# Patient Record
Sex: Female | Born: 1995 | Race: White | Hispanic: No | Marital: Single | Smoking: Never smoker
Health system: Southern US, Community
[De-identification: ages and names within clinical notes are randomized; demographics above are authoritative.]

## PROBLEM LIST (undated history)

## (undated) DIAGNOSIS — G8929 Other chronic pain: Secondary | ICD-10-CM

## (undated) DIAGNOSIS — S069XAA Unspecified intracranial injury with loss of consciousness status unknown, initial encounter: Secondary | ICD-10-CM

## (undated) DIAGNOSIS — E282 Polycystic ovarian syndrome: Secondary | ICD-10-CM

## (undated) DIAGNOSIS — M545 Low back pain: Secondary | ICD-10-CM

## (undated) DIAGNOSIS — M5136 Other intervertebral disc degeneration, lumbar region: Secondary | ICD-10-CM

## (undated) DIAGNOSIS — M25539 Pain in unspecified wrist: Secondary | ICD-10-CM

## (undated) DIAGNOSIS — A6000 Herpesviral infection of urogenital system, unspecified: Secondary | ICD-10-CM

## (undated) DIAGNOSIS — S069X9A Unspecified intracranial injury with loss of consciousness of unspecified duration, initial encounter: Secondary | ICD-10-CM

## (undated) DIAGNOSIS — F909 Attention-deficit hyperactivity disorder, unspecified type: Secondary | ICD-10-CM

## (undated) DIAGNOSIS — M5126 Other intervertebral disc displacement, lumbar region: Secondary | ICD-10-CM

## (undated) DIAGNOSIS — T7421XA Adult sexual abuse, confirmed, initial encounter: Secondary | ICD-10-CM

## (undated) DIAGNOSIS — M51369 Other intervertebral disc degeneration, lumbar region without mention of lumbar back pain or lower extremity pain: Secondary | ICD-10-CM

## (undated) DIAGNOSIS — G43909 Migraine, unspecified, not intractable, without status migrainosus: Secondary | ICD-10-CM

## (undated) HISTORY — DX: Migraine, unspecified, not intractable, without status migrainosus: G43.909

## (undated) HISTORY — DX: Herpesviral infection of urogenital system, unspecified: A60.00

## (undated) HISTORY — DX: Low back pain: M54.5

## (undated) HISTORY — DX: Pain in unspecified wrist: M25.539

## (undated) HISTORY — DX: Attention-deficit hyperactivity disorder, unspecified type: F90.9

## (undated) HISTORY — DX: Other chronic pain: G89.29

## (undated) HISTORY — DX: Polycystic ovarian syndrome: E28.2

## (undated) HISTORY — PX: BACK SURGERY: SHX140

## (undated) HISTORY — PX: WRIST SURGERY: SHX841

## (undated) HISTORY — PX: APPENDECTOMY: SHX54

## (undated) HISTORY — DX: Adult sexual abuse, confirmed, initial encounter: T74.21XA

---

## 2016-02-07 ENCOUNTER — Emergency Department: Payer: Non-veteran care

## 2016-02-07 ENCOUNTER — Encounter: Payer: Self-pay | Admitting: Emergency Medicine

## 2016-02-07 ENCOUNTER — Emergency Department
Admission: EM | Admit: 2016-02-07 | Discharge: 2016-02-07 | Disposition: A | Discharge status: Home / Self Care | Payer: Non-veteran care | Attending: Emergency Medicine | Admitting: Emergency Medicine

## 2016-02-07 DIAGNOSIS — Y9389 Activity, other specified: Secondary | ICD-10-CM | POA: Diagnosis not present

## 2016-02-07 DIAGNOSIS — Y999 Unspecified external cause status: Secondary | ICD-10-CM | POA: Insufficient documentation

## 2016-02-07 DIAGNOSIS — S39012A Strain of muscle, fascia and tendon of lower back, initial encounter: Secondary | ICD-10-CM | POA: Insufficient documentation

## 2016-02-07 DIAGNOSIS — Y92410 Unspecified street and highway as the place of occurrence of the external cause: Secondary | ICD-10-CM | POA: Diagnosis not present

## 2016-02-07 DIAGNOSIS — R079 Chest pain, unspecified: Secondary | ICD-10-CM

## 2016-02-07 DIAGNOSIS — S3992XA Unspecified injury of lower back, initial encounter: Secondary | ICD-10-CM | POA: Diagnosis present

## 2016-02-07 HISTORY — DX: Unspecified intracranial injury with loss of consciousness of unspecified duration, initial encounter: S06.9X9A

## 2016-02-07 HISTORY — DX: Other intervertebral disc displacement, lumbar region: M51.26

## 2016-02-07 HISTORY — DX: Other intervertebral disc degeneration, lumbar region without mention of lumbar back pain or lower extremity pain: M51.369

## 2016-02-07 HISTORY — DX: Unspecified intracranial injury with loss of consciousness status unknown, initial encounter: S06.9XAA

## 2016-02-07 HISTORY — DX: Other intervertebral disc degeneration, lumbar region: M51.36

## 2016-02-07 LAB — TROPONIN I

## 2016-02-07 LAB — HCG, QUANTITATIVE, PREGNANCY

## 2016-02-07 MED ORDER — FENTANYL CITRATE (PF) 100 MCG/2ML IJ SOLN
100.0000 ug | Freq: Once | INTRAMUSCULAR | Status: AC
  Administered 2016-02-07: 100 ug via INTRAVENOUS

## 2016-02-07 MED ORDER — OXYCODONE HCL 5 MG PO TABS: 5.0000 mg | ORAL_TABLET | Freq: Three times a day (TID) | ORAL | 0 refills | Status: DC | PRN

## 2016-02-07 MED ORDER — METHOCARBAMOL 500 MG PO TABS: 500.0000 mg | ORAL_TABLET | Freq: Four times a day (QID) | ORAL | 0 refills | Status: DC | PRN

## 2016-02-07 MED ORDER — ACETAMINOPHEN 500 MG PO TABS
1000.0000 mg | ORAL_TABLET | Freq: Once | ORAL | Status: AC
  Administered 2016-02-07: 1000 mg via ORAL
  Filled 2016-02-07: qty 2

## 2016-02-07 MED ORDER — KETOROLAC TROMETHAMINE 30 MG/ML IJ SOLN
30.0000 mg | Freq: Once | INTRAMUSCULAR | Status: AC
  Administered 2016-02-07: 30 mg via INTRAVENOUS
  Filled 2016-02-07: qty 1

## 2016-02-07 MED ORDER — ACETAMINOPHEN 500 MG PO TABS: 1000.0000 mg | ORAL_TABLET | Freq: Three times a day (TID) | ORAL | 0 refills | Status: DC | PRN

## 2016-02-07 MED ORDER — DIAZEPAM 5 MG/ML IJ SOLN: 2.5000 mg | Freq: Once | INTRAMUSCULAR | Status: DC

## 2016-02-07 MED ORDER — OXYCODONE HCL 5 MG PO TABS
5.0000 mg | ORAL_TABLET | Freq: Once | ORAL | Status: AC
  Administered 2016-02-07: 5 mg via ORAL
  Filled 2016-02-07: qty 1

## 2016-02-07 MED ORDER — LORAZEPAM 2 MG/ML IJ SOLN
INTRAMUSCULAR | Status: AC
  Administered 2016-02-07: 1 mg via INTRAVENOUS
  Filled 2016-02-07: qty 1

## 2016-02-07 MED ORDER — FENTANYL CITRATE (PF) 100 MCG/2ML IJ SOLN
INTRAMUSCULAR | Status: AC
  Administered 2016-02-07: 100 ug via INTRAVENOUS
  Filled 2016-02-07: qty 2

## 2016-02-07 MED ORDER — LORAZEPAM 2 MG/ML IJ SOLN
1.0000 mg | Freq: Once | INTRAMUSCULAR | Status: AC
  Administered 2016-02-07: 1 mg via INTRAVENOUS

## 2016-02-07 NOTE — ED Notes (Signed)
Pt oob in hall with stand-by assist. C/o back pain and voices the fear of having increased back pain. md notified.
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
## 2016-04-16 ENCOUNTER — Other Ambulatory Visit: Payer: Self-pay

## 2016-04-16 ENCOUNTER — Encounter: Payer: Self-pay | Admitting: Family Medicine

## 2016-04-16 ENCOUNTER — Ambulatory Visit (INDEPENDENT_AMBULATORY_CARE_PROVIDER_SITE_OTHER): Admitting: Family Medicine

## 2016-04-16 VITALS — BP 128/84 | HR 93 | Temp 98.3°F | Ht 65.5 in | Wt 196.6 lb

## 2016-04-16 DIAGNOSIS — R21 Rash and other nonspecific skin eruption: Secondary | ICD-10-CM | POA: Diagnosis not present

## 2016-04-16 DIAGNOSIS — Z87820 Personal history of traumatic brain injury: Secondary | ICD-10-CM | POA: Insufficient documentation

## 2016-04-16 DIAGNOSIS — F32A Depression, unspecified: Secondary | ICD-10-CM | POA: Insufficient documentation

## 2016-04-16 DIAGNOSIS — F909 Attention-deficit hyperactivity disorder, unspecified type: Secondary | ICD-10-CM | POA: Diagnosis not present

## 2016-04-16 DIAGNOSIS — M545 Low back pain, unspecified: Secondary | ICD-10-CM | POA: Insufficient documentation

## 2016-04-16 DIAGNOSIS — G8929 Other chronic pain: Secondary | ICD-10-CM | POA: Diagnosis not present

## 2016-04-16 DIAGNOSIS — F329 Major depressive disorder, single episode, unspecified: Secondary | ICD-10-CM | POA: Insufficient documentation

## 2016-04-16 DIAGNOSIS — T7421XA Adult sexual abuse, confirmed, initial encounter: Secondary | ICD-10-CM

## 2016-04-16 DIAGNOSIS — A6004 Herpesviral vulvovaginitis: Secondary | ICD-10-CM | POA: Diagnosis not present

## 2016-04-16 DIAGNOSIS — A6000 Herpesviral infection of urogenital system, unspecified: Secondary | ICD-10-CM

## 2016-04-16 DIAGNOSIS — E282 Polycystic ovarian syndrome: Secondary | ICD-10-CM

## 2016-04-16 DIAGNOSIS — M25539 Pain in unspecified wrist: Secondary | ICD-10-CM

## 2016-04-16 DIAGNOSIS — Z975 Presence of (intrauterine) contraceptive device: Secondary | ICD-10-CM | POA: Insufficient documentation

## 2016-04-16 HISTORY — DX: Polycystic ovarian syndrome: E28.2

## 2016-04-16 HISTORY — DX: Attention-deficit hyperactivity disorder, unspecified type: F90.9

## 2016-04-16 HISTORY — DX: Herpesviral infection of urogenital system, unspecified: A60.00

## 2016-04-16 HISTORY — DX: Other chronic pain: G89.29

## 2016-04-16 HISTORY — DX: Low back pain, unspecified: M54.50

## 2016-04-16 HISTORY — DX: Adult sexual abuse, confirmed, initial encounter: T74.21XA

## 2016-04-16 MED ORDER — VALACYCLOVIR HCL 1 G PO TABS: 1000.0000 mg | ORAL_TABLET | Freq: Every day | ORAL | 3 refills | Status: AC

## 2016-04-16 MED ORDER — GABAPENTIN 300 MG PO CAPS: 300.0000 mg | ORAL_CAPSULE | Freq: Three times a day (TID) | ORAL | 3 refills | Status: DC

## 2016-04-16 MED ORDER — DESONIDE 0.05 % EX OINT: 1.0000 "application " | TOPICAL_OINTMENT | Freq: Two times a day (BID) | CUTANEOUS | 0 refills | Status: AC

## 2016-04-16 MED ORDER — VALACYCLOVIR HCL 1 G PO TABS: 1000.0000 mg | ORAL_TABLET | Freq: Every day | ORAL | 3 refills | Status: DC

## 2016-04-16 MED ORDER — GABAPENTIN 300 MG PO CAPS: 300.0000 mg | ORAL_CAPSULE | Freq: Three times a day (TID) | ORAL | 3 refills | Status: AC

## 2016-04-16 MED ORDER — DESONIDE 0.05 % EX OINT: 1.0000 "application " | TOPICAL_OINTMENT | Freq: Two times a day (BID) | CUTANEOUS | 0 refills | Status: DC

## 2016-04-16 NOTE — Assessment & Plan Note (Signed)
Stable on Adderall. Needs to see psychology to confirm.

## 2016-04-16 NOTE — Assessment & Plan Note (Signed)
New problem (to me). Starting on Valtrex (for current outbreak and prophylaxis).

## 2016-04-16 NOTE — Assessment & Plan Note (Signed)
New problem. Treating with Desonide (suspect eczema vs contact dermatitis vs perioral dermatitis).

## 2016-04-16 NOTE — Progress Notes (Signed)
Pre visit review using our clinic review tool, if applicable. No additional management support is needed unless otherwise documented below in the visit note. 

## 2016-04-16 NOTE — Assessment & Plan Note (Signed)
Stable. Refilling Gabapentin.

## 2016-04-16 NOTE — Patient Instructions (Signed)
Valtrex as prescribed.  Continue the Gabapentin.  We will arrange for you to see the psychologist for ADHD testing.  Take care  Dr. Adriana Simasook
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
## 2016-05-09 ENCOUNTER — Other Ambulatory Visit: Payer: Self-pay | Admitting: Family Medicine

## 2016-05-09 ENCOUNTER — Telehealth: Payer: Self-pay | Admitting: Family Medicine

## 2016-05-09 DIAGNOSIS — F909 Attention-deficit hyperactivity disorder, unspecified type: Secondary | ICD-10-CM

## 2016-05-09 MED ORDER — AMPHETAMINE-DEXTROAMPHETAMINE 15 MG PO TABS: 15.0000 mg | ORAL_TABLET | Freq: Two times a day (BID) | ORAL | 0 refills | Status: DC

## 2016-05-09 NOTE — Telephone Encounter (Signed)
Pt made aware of medication being placed up front.

## 2016-05-09 NOTE — Telephone Encounter (Signed)
Pt also stated she was advised she would be sent for testing as well for documentation.

## 2016-05-09 NOTE — Telephone Encounter (Signed)
Pt called about needing a refill for medication of amphetamine-dextroamphetamine (ADDERALL) 30 MG tablet. Pt has 1 more pill left.   [facility] [address] [facility] [place], [place] [facility] [id number] [facility] [address] [address]  Call pt @ [phone]. Thank you!

## 2016-05-09 NOTE — Telephone Encounter (Signed)
Sorry about that. Referral placed. Can pick up Rx.

## 2016-05-15 ENCOUNTER — Telehealth: Payer: Self-pay | Admitting: Family Medicine

## 2016-05-15 NOTE — Telephone Encounter (Signed)
Per Terri @ [facility]-they have tried contacting the patient to schedule appointment. The voice mail is full and no email is available. Pt is to call office to schedule appointment. [phone]

## 2016-05-16 ENCOUNTER — Ambulatory Visit (INDEPENDENT_AMBULATORY_CARE_PROVIDER_SITE_OTHER): Admitting: Family Medicine

## 2016-05-16 ENCOUNTER — Encounter: Payer: Self-pay | Admitting: Family Medicine

## 2016-05-16 DIAGNOSIS — R5383 Other fatigue: Secondary | ICD-10-CM

## 2016-05-16 DIAGNOSIS — G8929 Other chronic pain: Secondary | ICD-10-CM

## 2016-05-16 DIAGNOSIS — E669 Obesity, unspecified: Secondary | ICD-10-CM

## 2016-05-16 DIAGNOSIS — M545 Low back pain, unspecified: Secondary | ICD-10-CM

## 2016-05-16 DIAGNOSIS — E66811 Obesity, class 1: Secondary | ICD-10-CM

## 2016-05-16 NOTE — Patient Instructions (Signed)
Find some time for yourself.  You need to find a better work life balance.  Increase the gabapentin slowly in increments of 300 mg. (Max 1200 mg three times daily).  Follow up annually  Take care  Dr. Adriana Simasook

## 2016-05-16 NOTE — Progress Notes (Signed)
Pre visit review using our clinic review tool, if applicable. No additional management support is needed unless otherwise documented below in the visit note. 

## 2016-05-20 DIAGNOSIS — E669 Obesity, unspecified: Secondary | ICD-10-CM | POA: Insufficient documentation

## 2016-05-20 DIAGNOSIS — R5383 Other fatigue: Secondary | ICD-10-CM | POA: Insufficient documentation

## 2016-05-20 NOTE — Progress Notes (Signed)
   Subjective:  Patient ID: Cynthia Watson, female    DOB: 05-02-1995  Age: 21 y.o. MRN: [id number]  CC: Follow up   HPI:  21 year old female presents for follow up.  Issues/concerns are below.  Back pain   Patient has chronic low back pain.  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  She is working hard to improve her low back pain as well as lose weight.  However, she continues to struggle with pain.  She is currently taking gabapentin 300 mg twice daily.  She would like to discuss increasing this.  Fatigue  Patient experiencing significant fatigue.  Patient states that she sleeps about 4 hours a night. She works over 12 hours a day.  She had no difficulty sleeping.  She's attempting to work out regularly but just very difficult given her current work schedule.  Obesity  Patient is unhappy with her weight.  She tries to workout regularly.  This is somewhat limited by her job.  Needs better dietary control.  She would like to discuss options regarding weight loss today.  Social Hx   Social History   Social History  . Marital status: Single    Spouse name: N/A  . Number of children: N/A  . Years of education: N/A   Social History Main Topics  . Smoking status: Never Smoker  . Smokeless tobacco: Never Used  . Alcohol use 0.6 - 1.2 oz/week    1 - 2 Standard drinks or equivalent per week  . Drug use: No  . Sexual activity: Not Currently    Partners: Male    Birth control/ protection: IUD   Other Topics Concern  . None   Social History Narrative  . None    Review of Systems  Constitutional: Positive for fatigue.  Musculoskeletal: Positive for back pain.   Objective:  BP 128/80   Pulse 80   Temp 98.5 F (36.9 C) (Oral)   Wt 195 lb 6 oz (88.6 kg)   SpO2 95%   BMI 32.02 kg/m   BP/Weight 05/16/2016 04/16/2016 02/07/2016  Systolic BP 128 128 140  Diastolic BP 80 84 83  Wt. (Lbs) 195.38 196.6 200    BMI 32.02 32.22 32.28    Physical Exam  Constitutional: She is oriented to person, place, and time. She appears well-developed. No distress.  Cardiovascular: Normal rate and regular rhythm.   Pulmonary/Chest: Effort normal and breath sounds normal.  Neurological: She is alert and oriented to person, place, and time.  Psychiatric:  Flat affect, depressed mood.  Vitals reviewed.   Assessment & Plan:   Problem List Items Addressed This Visit    Chronic low back pain    Advised to increase gabapentin slowly in increments of 300 mg. She's going to start by taking 600 mg in the am and 300 mg later in the day.      Fatigue    New problem. Secondary to lack of sleep, depression, overworking. Advised her to find a better work-life balance.      Obesity (BMI 30.0-34.9)    Lengthy discussion today about diet and exercise changes as well as getting adequate sleep.          Follow-up: Annually  Everlene Other DO [facility]

## 2016-05-20 NOTE — Assessment & Plan Note (Signed)
Advised to increase gabapentin slowly in increments of 300 mg. She's going to start by taking 600 mg in the am and 300 mg later in the day.

## 2016-05-20 NOTE — Assessment & Plan Note (Signed)
New problem. Secondary to lack of sleep, depression, overworking. Advised her to find a better work-life balance.

## 2016-05-20 NOTE — Assessment & Plan Note (Signed)
Lengthy discussion today about diet and exercise changes as well as getting adequate sleep.

## 2016-06-14 ENCOUNTER — Ambulatory Visit (INDEPENDENT_AMBULATORY_CARE_PROVIDER_SITE_OTHER): Admitting: Physician Assistant

## 2016-06-14 ENCOUNTER — Other Ambulatory Visit: Payer: Self-pay | Admitting: Family Medicine

## 2016-06-14 ENCOUNTER — Encounter: Payer: Self-pay | Admitting: Physician Assistant

## 2016-06-14 VITALS — BP 130/80 | HR 96 | Temp 98.5°F | Ht 65.5 in | Wt 194.4 lb

## 2016-06-14 DIAGNOSIS — J029 Acute pharyngitis, unspecified: Secondary | ICD-10-CM

## 2016-06-14 DIAGNOSIS — H6690 Otitis media, unspecified, unspecified ear: Secondary | ICD-10-CM

## 2016-06-14 LAB — POCT RAPID STREP A (OFFICE): RAPID STREP A SCREEN: NEGATIVE

## 2016-06-14 MED ORDER — FLUCONAZOLE 150 MG PO TABS: 150.0000 mg | ORAL_TABLET | Freq: Once | ORAL | 0 refills | Status: AC

## 2016-06-14 MED ORDER — PREDNISONE 20 MG PO TABS: 40.0000 mg | ORAL_TABLET | Freq: Every day | ORAL | 0 refills | Status: DC

## 2016-06-14 MED ORDER — AMOXICILLIN 875 MG PO TABS: 875.0000 mg | ORAL_TABLET | Freq: Two times a day (BID) | ORAL | 0 refills | Status: DC

## 2016-06-14 NOTE — Telephone Encounter (Signed)
Pt called stating that she needs a refill on her amphetamine-dextroamphetamine (ADDERALL) 15 MG tablet. She has an appointment for 6/26 with the doctor you referred her to for testing. Please advise, thank you!  Call pt @ [phone]

## 2016-06-14 NOTE — Telephone Encounter (Signed)
Refilled: 05/09/16 Last OV: 05/16/16 Last Labs:  Future OV: none Please advise?

## 2016-06-14 NOTE — Progress Notes (Signed)
Pre visit review using our clinic review tool, if applicable. No additional management support is needed unless otherwise documented below in the visit note. 

## 2016-06-14 NOTE — Patient Instructions (Signed)
Please start the antibiotic for your right ear infection.  Start the oral steroid for your throat, take with food.  Unfortunately, we are unable to refill your Adderall, you will need to contact the office of your PCP, Dr. Everlene Other in order to have this refilled.  If you develop a yeast infection, I have sent in a tablet of Diflucan for you to take.   Otitis Media, Adult Otitis media is redness, soreness, and puffiness (swelling) in the space just behind your eardrum (middle ear). It may be caused by allergies or infection. It often happens along with a cold. Follow these instructions at home:  Take your medicine as told. Finish it even if you start to feel better.  Only take over-the-counter or prescription medicines for pain, discomfort, or fever as told by your doctor.  Follow up with your doctor as told. Contact a doctor if:  You have otitis media only in one ear, or bleeding from your nose, or both.  You notice a lump on your neck.  You are not getting better in 3-5 days.  You feel worse instead of better. Get help right away if:  You have pain that is not helped with medicine.  You have puffiness, redness, or pain around your ear.  You get a stiff neck.  You cannot move part of your face (paralysis).  You notice that the bone behind your ear hurts when you touch it. This information is not intended to replace advice given to you by your health care provider. Make sure you discuss any questions you have with your health care provider. Document Released: 07/24/2007 Document Revised: 07/13/2015 Document Reviewed: 09/01/2012 Elsevier Interactive Patient Education  2017 [employer].

## 2016-06-14 NOTE — Progress Notes (Signed)
Cynthia Watson is a 21 y.o. female here for Sore throat, bilateral ear pain.  I acted as a Neurosurgeon for [employer], PA-C Cynthia Mull, LPN  History of Present Illness:   Chief Complaint  Patient presents with  . Sore Throat    x 2 days  . Laryngitis  . Fever    off and on  . Bilateral ear pain    HPI   Patient reports that she has developed sore throat, bilateral ear pain, decreased appetite and productive cough with yellow sputum over the past two days. Appetite is fair. No diarrhea or n/v. She states that one of her co-workers has been sick. Has taken Dayquil a few times without relief. No history of asthma. Non-smoker. She has had subjective fevers. Denies discharge from ears or hearing loss.  PMHx, SurgHx, SocialHx, Medications, and Allergies were reviewed in the Visit Navigator and updated as appropriate.  Current Medications:   Current Outpatient Prescriptions:  .  acetaminophen (TYLENOL) 500 MG tablet, Take 2 tablets (1,000 mg total) by mouth every 8 (eight) hours as needed for moderate pain., Disp: 100 tablet, Rfl: 0 .  amphetamine-dextroamphetamine (ADDERALL) 15 MG tablet, Take 1 tablet by mouth 2 (two) times daily., Disp: 60 tablet, Rfl: 0 .  desonide (DESOWEN) 0.05 % ointment, Apply 1 application topically 2 (two) times daily., Disp: 60 g, Rfl: 0 .  gabapentin (NEURONTIN) 300 MG capsule, Take 1 capsule (300 mg total) by mouth 3 (three) times daily., Disp: 270 capsule, Rfl: 3 .  levonorgestrel (MIRENA) 20 MCG/24HR IUD, 1 each by Intrauterine route once., Disp: , Rfl:  .  valACYclovir (VALTREX) 1000 MG tablet, Take 1 tablet (1,000 mg total) by mouth daily., Disp: 90 tablet, Rfl: 3 .  amoxicillin (AMOXIL) 875 MG tablet, Take 1 tablet (875 mg total) by mouth 2 (two) times daily., Disp: 20 tablet, Rfl: 0 .  fluconazole (DIFLUCAN) 150 MG tablet, Take 1 tablet (150 mg total) by mouth once., Disp: 1 tablet, Rfl: 0 .  predniSONE (DELTASONE) 20 MG tablet, Take 2 tablets (40  mg total) by mouth daily., Disp: 10 tablet, Rfl: 0   Review of Systems:   Review of Systems  Constitutional: Positive for chills, fever and malaise/fatigue.  HENT: Positive for ear pain and sore throat.   Eyes: Negative.   Respiratory: Positive for cough and shortness of breath.   Cardiovascular: Negative.   Gastrointestinal: Negative.   Genitourinary: Negative.   Musculoskeletal: Negative.   Skin: Negative.   Neurological: Negative.   Endo/Heme/Allergies: Negative.   Psychiatric/Behavioral: Negative.     Vitals:   Vitals:   06/14/16 1052  BP: 130/80  Pulse: 96  Temp: 98.5 F (36.9 C)  TempSrc: Oral  SpO2: 98%  Weight: 194 lb 6.1 oz (88.2 kg)  Height: 5' 5.5" (1.664 m)     Body mass index is 31.85 kg/m.  Physical Exam:   Physical Exam  Constitutional: She appears well-developed. She is cooperative.  Non-toxic appearance. She does not have a sickly appearance. She does not appear ill. No distress.  HENT:  Head: Normocephalic and atraumatic.  Right Ear: There is tenderness. Tympanic membrane is erythematous. Tympanic membrane is not retracted and not bulging.  Left Ear: Tympanic membrane, external ear and ear canal normal. Tympanic membrane is not erythematous, not retracted and not bulging.  Nose: Nose normal. Right sinus exhibits no maxillary sinus tenderness and no frontal sinus tenderness. Left sinus exhibits no maxillary sinus tenderness and no frontal sinus tenderness.  Mouth/Throat: Uvula  is midline and mucous membranes are normal. Posterior oropharyngeal edema and posterior oropharyngeal erythema present. No oropharyngeal exudate. Tonsils are 2+ on the right. Tonsils are 2+ on the left. No tonsillar exudate.  Eyes: Conjunctivae and lids are normal.  Neck: Trachea normal.  Cardiovascular: Normal rate, regular rhythm, S1 normal, S2 normal and normal heart sounds.   Pulmonary/Chest: Effort normal and breath sounds normal. She has no decreased breath sounds. She has  no wheezes. She has no rhonchi. She has no rales.  Lymphadenopathy:    She has no cervical adenopathy.  Neurological: She is alert.  Skin: Skin is warm, dry and intact.  Psychiatric: She has a normal mood and affect. Her speech is normal and behavior is normal.  Nursing note and vitals reviewed.   Results for orders placed or performed in visit on 06/14/16  POCT rapid strep A  Result Value Ref Range   Rapid Strep A Screen Negative Negative     Assessment and Plan:    Cynthia Watson was seen today for sore throat, laryngitis, fever and bilateral ear pain.  Diagnoses and all orders for this visit:  Acute otitis media, unspecified otitis media type Will treat with amoxicillin per orders. She has a history of antibiotic-induced yeast infection, I have sent in 1 diflucan for her to take if needed, per her request. Follow-up with PCP if additional signs/symptoms arise. Patient is agreeable to plan.  Pharyngitis, unspecified etiology Rapid strep test negative. She does have some mild swelling that I would like to treat with a brief course of prednisone per orders. She has taken this medication in the past and tolerated well. I advised that if she develops in any issues with opening/closing her mouth or with breathing, she needs to go to the ER. Patient verbalized understanding.  Other orders -     amoxicillin (AMOXIL) 875 MG tablet; Take 1 tablet (875 mg total) by mouth 2 (two) times daily. -     fluconazole (DIFLUCAN) 150 MG tablet; Take 1 tablet (150 mg total) by mouth once. -     predniSONE (DELTASONE) 20 MG tablet; Take 2 tablets (40 mg total) by mouth daily.   . Reviewed expectations re: course of current medical issues. . Discussed self-management of symptoms. . Outlined signs and symptoms indicating need for more acute intervention. . Patient verbalized understanding and all questions were answered. . See orders for this visit as documented in the electronic medical  record. . Patient received an After-Visit Summary.  CMA or LPN served as scribe during this visit. History, Physical, and Plan performed by medical provider. Documentation and orders reviewed and attested to.  Cynthia Motto, PA-C

## 2016-06-18 ENCOUNTER — Telehealth: Payer: Self-pay

## 2016-06-18 MED ORDER — AMPHETAMINE-DEXTROAMPHETAMINE 15 MG PO TABS: 15.0000 mg | ORAL_TABLET | Freq: Two times a day (BID) | ORAL | 0 refills | Status: DC

## 2016-06-18 NOTE — Telephone Encounter (Signed)
Pt called and was told she will need to make appt on June 26 to get anymore refills. She was told to come by around 4 p.m. To pick up rx.

## 2016-06-18 NOTE — Addendum Note (Signed)
Addended by: Jennelle Human on: 06/18/2016 02:07 PM   Modules accepted: Orders

## 2016-06-18 NOTE — Telephone Encounter (Signed)
Pt was told to pick up rx here at the office.

## 2016-06-18 NOTE — Telephone Encounter (Signed)
Good Morning! I called last week and left a message for my Primary, Dr. Everlene Other and have not heard back so I am just following up! The earliest I am able to get in for my ADHD testing is on June 26th and need a refill now and until then. Really hope to hear back asap. Thank you :)     Patient sent above message as a regular email to the [employer], I see that the Rx was denied.  Please advise and notify the patient. thanks

## 2016-08-13 ENCOUNTER — Ambulatory Visit (INDEPENDENT_AMBULATORY_CARE_PROVIDER_SITE_OTHER): Admitting: Psychology

## 2016-08-13 DIAGNOSIS — F0280 Dementia in other diseases classified elsewhere without behavioral disturbance: Secondary | ICD-10-CM

## 2016-08-13 DIAGNOSIS — F902 Attention-deficit hyperactivity disorder, combined type: Secondary | ICD-10-CM

## 2016-08-13 DIAGNOSIS — F439 Reaction to severe stress, unspecified: Secondary | ICD-10-CM | POA: Diagnosis not present

## 2016-08-13 DIAGNOSIS — F329 Major depressive disorder, single episode, unspecified: Secondary | ICD-10-CM | POA: Diagnosis not present

## 2016-08-28 ENCOUNTER — Ambulatory Visit (INDEPENDENT_AMBULATORY_CARE_PROVIDER_SITE_OTHER): Admitting: Family Medicine

## 2016-08-28 DIAGNOSIS — F322 Major depressive disorder, single episode, severe without psychotic features: Secondary | ICD-10-CM | POA: Diagnosis not present

## 2016-08-28 MED ORDER — AMPHETAMINE-DEXTROAMPHETAMINE 15 MG PO TABS: 15.0000 mg | ORAL_TABLET | Freq: Two times a day (BID) | ORAL | 0 refills | Status: DC

## 2016-08-28 MED ORDER — SERTRALINE HCL 50 MG PO TABS: 50.0000 mg | ORAL_TABLET | Freq: Every day | ORAL | 3 refills | Status: DC

## 2016-08-28 NOTE — Patient Instructions (Addendum)
Take the medication as prescribed.  We will call regarding seeing the psychologist/counselor.  Follow up in 2 weeks.  If you worsen, call and let me know.   Take care  Dr. Adriana Simasook
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
## 2016-09-05 ENCOUNTER — Telehealth: Payer: Self-pay | Admitting: Family Medicine

## 2016-09-05 NOTE — Telephone Encounter (Signed)
Pt saw Dr. Adriana Simasook last week. He put her on sertraline (ZOLOFT) 50 MG tablet. Pt is not feeling any better she is feeling worse. Please advise.

## 2016-09-05 NOTE — Telephone Encounter (Signed)
Please advise 

## 2016-09-05 NOTE — Telephone Encounter (Signed)
She has to give it some time. Takes weeks to work. She should consider seeing psychiatry.

## 2016-09-05 NOTE — Telephone Encounter (Signed)
Patient notified and states she does not want to see psychiatry but does want to see a therapist

## 2016-09-12 ENCOUNTER — Ambulatory Visit: Payer: Self-pay | Admitting: Family Medicine

## 2016-09-20 ENCOUNTER — Ambulatory Visit (INDEPENDENT_AMBULATORY_CARE_PROVIDER_SITE_OTHER): Admitting: Family Medicine

## 2016-09-20 ENCOUNTER — Encounter: Payer: Self-pay | Admitting: Family Medicine

## 2016-09-20 DIAGNOSIS — F322 Major depressive disorder, single episode, severe without psychotic features: Secondary | ICD-10-CM | POA: Diagnosis not present

## 2016-09-20 MED ORDER — BUPROPION HCL 100 MG PO TABS
100.0000 mg | ORAL_TABLET | Freq: Two times a day (BID) | ORAL | 3 refills | Status: AC
Start: 2016-09-20 — End: ?

## 2016-09-20 NOTE — Patient Instructions (Addendum)
Wellbutrin as prescribed.  Go to the [facility] (they have a walkin psychiatry clinic).  Follow up in 2 weeks.  Take care  Dr. Adriana Simasook  b

## 2016-09-23 NOTE — Assessment & Plan Note (Signed)
Established problem, worsening. Starting Wellbutrin. I encouraged the patient to see a counselor. I recommend that she see psychiatry. I discussed her case with Dr. Maryruth BunKapur. She agrees with Wellbutrin and would like to see her. I discussed this with the patient and she is in agreement. Will place a referral.

## 2016-09-23 NOTE — Progress Notes (Signed)
   Subjective:  Patient ID: Cynthia Watson, female    DOB: [birth year]  Age: 21 y.o. MRN: [id number]  CC: Follow up  HPI:  21 year old female presents for follow-up.  Patient has not had any improvement on Zoloft. In fact, she states that it worsened her symptoms. She continues to have signs and symptoms of severe depression. Feeling down/depressed, little interest or motivation in doing things, excessive sleep. She has now stopped her Zoloft. She states that she has been told by her mother that Wellbutrin works well. She wants to discuss giving this a try. Psychology referral did not go through yet. She called the [facility] and is awaiting a phone call on Monday about seeing a counselor.  Social Hx   Social History   Social History  . Marital status: Single    Spouse name: N/A  . Number of children: N/A  . Years of education: N/A   Social History Main Topics  . Smoking status: Never Smoker  . Smokeless tobacco: Never Used  . Alcohol use 0.6 - 1.2 oz/week    1 - 2 Standard drinks or equivalent per week  . Drug use: No  . Sexual activity: Not Currently    Partners: Male    Birth control/ protection: IUD   Other Topics Concern  . None   Social History Narrative  . None    Review of Systems  Psychiatric/Behavioral: Negative for suicidal ideas.       Depression.   Objective:  BP 118/86 (BP Location: Right Arm, Patient Position: Sitting, Cuff Size: Normal)   Pulse 86   Temp 98.9 F (37.2 C) (Oral)   Wt 207 lb 8 oz (94.1 kg)   SpO2 98%   BMI 34.00 kg/m   BP/Weight 09/20/2016 08/28/2016 06/14/2016  Systolic BP 118 124 130  Diastolic BP 86 84 80  Wt. (Lbs) 207.5 209.13 194.38  BMI 34 34.27 31.85    Physical Exam  Constitutional: She is oriented to person, place, and time. She appears well-developed. No distress.  HENT:  Head: Normocephalic and atraumatic.  Eyes: Conjunctivae are normal. No scleral icterus.  Pulmonary/Chest: Effort normal. No respiratory distress.    Neurological: She is alert and oriented to person, place, and time.  Psychiatric: Her affect is blunt. Her speech is delayed. She is slowed and withdrawn. She exhibits a depressed mood. She expresses no suicidal ideation.  Vitals reviewed.  Assessment & Plan:   Problem List Items Addressed This Visit    Major depressive disorder, severe (HCC)    Established problem, worsening. Starting Wellbutrin. I encouraged the patient to see a counselor. I recommend that she see psychiatry. I discussed her case with Dr. Maryruth BunKapur. She agrees with Wellbutrin and would like to see her. I discussed this with the patient and she is in agreement. Will place a referral.      Relevant Medications   buPROPion (WELLBUTRIN) 100 MG tablet   Other Relevant Orders   Ambulatory referral to Psychiatry      Meds ordered this encounter  Medications  . buPROPion (WELLBUTRIN) 100 MG tablet    Sig: Take 1 tablet (100 mg total) by mouth 2 (two) times daily.    Dispense:  60 tablet    Refill:  3   Follow-up: 2 weeks  Everlene OtherJayce Afnan Cadiente DO [facility] [place]

## 2016-10-04 ENCOUNTER — Encounter: Payer: Self-pay | Admitting: Family Medicine

## 2016-10-04 ENCOUNTER — Ambulatory Visit (INDEPENDENT_AMBULATORY_CARE_PROVIDER_SITE_OTHER): Admitting: Family Medicine

## 2016-10-04 DIAGNOSIS — F322 Major depressive disorder, single episode, severe without psychotic features: Secondary | ICD-10-CM | POA: Diagnosis not present

## 2016-10-04 DIAGNOSIS — F909 Attention-deficit hyperactivity disorder, unspecified type: Secondary | ICD-10-CM

## 2016-10-04 MED ORDER — AMPHETAMINE-DEXTROAMPHETAMINE 15 MG PO TABS: 15.0000 mg | ORAL_TABLET | Freq: Two times a day (BID) | ORAL | 0 refills | Status: AC

## 2016-10-04 MED ORDER — SUMATRIPTAN SUCCINATE 50 MG PO TABS: 50.0000 mg | ORAL_TABLET | ORAL | 0 refills | Status: AC | PRN

## 2016-10-04 NOTE — Patient Instructions (Signed)
Continue your medications.  Follow up closely with psychiatry and your counselor.  Best of luck  Dr. Adriana Simas

## 2016-10-04 NOTE — Assessment & Plan Note (Signed)
Slight worsening of symptoms in setting of depression. However, she is doing okay on Adderall. Will continue.

## 2016-10-04 NOTE — Assessment & Plan Note (Signed)
Slight improvement. Continue Wellbutrin. Patient has scheduled follow-up to see her counselor and to see psychiatry later this month.

## 2016-10-04 NOTE — Progress Notes (Signed)
Subjective:  Patient ID: Cynthia Watson, female    DOB: 01-31-1996  Age: 21 y.o. MRN: [id number]  CC: Follow up   HPI:  21 year old female with a complicated past medical history presents for follow-up regarding major depressive disorder.  Patient has had a slight improvement since our last visit. She is compliant with the Wellbutrin. She has reached out and has appointment with psychiatry and her counselor later this month. She states that she is also moving to [place] to be closer to family and friends. She thinks that this will help. She states that she's trying to exercise and feels slightly more motivated. No adverse effects of the Wellbutrin. She still struggles with her mood. No other complaints or concerns.  Regarding her ADHD, her symptoms are slightly worse given depression. She is complaint with Adderall. Needs refill today.  Social Hx   Social History   Social History  . Marital status: Single    Spouse name: N/A  . Number of children: N/A  . Years of education: N/A   Social History Main Topics  . Smoking status: Never Smoker  . Smokeless tobacco: Never Used  . Alcohol use 0.6 - 1.2 oz/week    1 - 2 Standard drinks or equivalent per week  . Drug use: No  . Sexual activity: Not Currently    Partners: Male    Birth control/ protection: IUD   Other Topics Concern  . None   Social History Narrative  . None    Review of Systems  Psychiatric/Behavioral: Negative for suicidal ideas.       Depression.   Objective:  BP 104/82 (BP Location: Left Arm, Patient Position: Sitting, Cuff Size: Large)   Pulse 97   Temp 98.9 F (37.2 C) (Oral)   Resp 16   Wt 205 lb 2 oz (93 kg)   SpO2 98%   BMI 33.62 kg/m   BP/Weight 10/04/2016 09/20/2016 08/28/2016  Systolic BP 104 118 124  Diastolic BP 82 86 84  Wt. (Lbs) 205.13 207.5 209.13  BMI 33.62 34 34.27    Physical Exam  Constitutional: She is oriented to person, place, and time. She appears well-developed. No  distress.  Cardiovascular: Normal rate and regular rhythm.   Pulmonary/Chest: Effort normal and breath sounds normal. She has no wheezes. She has no rales.  Neurological: She is alert and oriented to person, place, and time.  Psychiatric:  Flat affect, depressed mood.  Vitals reviewed.  Assessment & Plan:   Problem List Items Addressed This Visit    ADHD    Slight worsening of symptoms in setting of depression. However, she is doing okay on Adderall. Will continue.      Major depressive disorder, severe (HCC)    Slight improvement. Continue Wellbutrin. Patient has scheduled follow-up to see her counselor and to see psychiatry later this month.        Meds ordered this encounter  Medications  . DISCONTD: SUMAtriptan (IMITREX) 50 MG tablet    Sig: Take 50 mg by mouth every 2 (two) hours as needed for migraine. May repeat in 2 hours if headache persists or recurs.  Marland Kitchen amphetamine-dextroamphetamine (ADDERALL) 15 MG tablet    Sig: Take 1 tablet by mouth 2 (two) times daily.    Dispense:  60 tablet    Refill:  0  . SUMAtriptan (IMITREX) 50 MG tablet    Sig: Take 1 tablet (50 mg total) by mouth every 2 (two) hours as needed for migraine. May repeat in 2  hours if headache persists or recurs.    Dispense:  10 tablet    Refill:  0   Follow-up: PRN  Everlene Other DO [facility]

## 2017-09-04 ENCOUNTER — Encounter: Payer: Self-pay | Admitting: Obstetrics and Gynecology

## 2017-09-08 ENCOUNTER — Encounter: Payer: Self-pay | Admitting: Obstetrics and Gynecology
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
